# Patient Record
Sex: Female | Born: 1960 | Race: White | Hispanic: No | Smoking: Former smoker
Health system: Southern US, Community
[De-identification: ages and names within clinical notes are randomized; demographics above are authoritative.]

## PROBLEM LIST (undated history)

## (undated) DIAGNOSIS — F32A Depression, unspecified: Secondary | ICD-10-CM

## (undated) DIAGNOSIS — E079 Disorder of thyroid, unspecified: Secondary | ICD-10-CM

## (undated) DIAGNOSIS — E7800 Pure hypercholesterolemia, unspecified: Secondary | ICD-10-CM

## (undated) DIAGNOSIS — K219 Gastro-esophageal reflux disease without esophagitis: Secondary | ICD-10-CM

## (undated) HISTORY — PX: CHOLECYSTECTOMY: SHX55

## (undated) HISTORY — DX: Depression, unspecified: F32.A

## (undated) HISTORY — DX: Pure hypercholesterolemia, unspecified: E78.00

## (undated) HISTORY — DX: Gastro-esophageal reflux disease without esophagitis: K21.9

## (undated) HISTORY — DX: Disorder of thyroid, unspecified: E07.9

## (undated) HISTORY — PX: HERNIA REPAIR: SHX51

## (undated) HISTORY — PX: ABDOMINAL HYSTERECTOMY: SHX81

## (undated) HISTORY — PX: BRAIN SURGERY: SHX531

---

## 2020-01-13 ENCOUNTER — Emergency Department (HOSPITAL_BASED_OUTPATIENT_CLINIC_OR_DEPARTMENT_OTHER)
Admission: EM | Admit: 2020-01-13 | Discharge: 2020-01-13 | Disposition: A | Discharge status: Home / Self Care | Payer: BC Managed Care – PPO | Attending: Emergency Medicine | Admitting: Emergency Medicine

## 2020-01-13 ENCOUNTER — Emergency Department (HOSPITAL_BASED_OUTPATIENT_CLINIC_OR_DEPARTMENT_OTHER): Payer: BC Managed Care – PPO

## 2020-01-13 ENCOUNTER — Other Ambulatory Visit: Payer: Self-pay

## 2020-01-13 ENCOUNTER — Encounter (HOSPITAL_BASED_OUTPATIENT_CLINIC_OR_DEPARTMENT_OTHER): Payer: Self-pay

## 2020-01-13 DIAGNOSIS — X58XXXA Exposure to other specified factors, initial encounter: Secondary | ICD-10-CM | POA: Diagnosis not present

## 2020-01-13 DIAGNOSIS — Y9289 Other specified places as the place of occurrence of the external cause: Secondary | ICD-10-CM | POA: Insufficient documentation

## 2020-01-13 DIAGNOSIS — S4992XA Unspecified injury of left shoulder and upper arm, initial encounter: Secondary | ICD-10-CM

## 2020-01-13 DIAGNOSIS — S40922A Unspecified superficial injury of left upper arm, initial encounter: Secondary | ICD-10-CM | POA: Insufficient documentation

## 2020-01-13 DIAGNOSIS — Y999 Unspecified external cause status: Secondary | ICD-10-CM | POA: Insufficient documentation

## 2020-01-13 DIAGNOSIS — E079 Disorder of thyroid, unspecified: Secondary | ICD-10-CM | POA: Diagnosis not present

## 2020-01-13 DIAGNOSIS — Z87891 Personal history of nicotine dependence: Secondary | ICD-10-CM | POA: Insufficient documentation

## 2020-01-13 DIAGNOSIS — Y939 Activity, unspecified: Secondary | ICD-10-CM | POA: Diagnosis not present

## 2020-01-13 NOTE — ED Triage Notes (Signed)
Pt states she closed left UE in a freight elevator ~445pm-pain to left elbow area-NAD-steady gait

## 2020-01-13 NOTE — ED Provider Notes (Signed)
[facility] EMERGENCY DEPARTMENT Provider Note   CSN: [id number] Arrival date & time: 01/13/20  1727     History Chief Complaint  Patient presents with  . Arm Injury    Kristen Kerr is a 59 y.o. female with no pertinent past medical history that presents to the emergency department today for left arm injury.  Patient states that she had her elbow closed in freight elevator, around 4 hours ago.  States that she was able to pull her arm out, no twisting sensation or motion.  No popping.  Patient states that she immediately felt pain upon her elbow area, has never had injury to this area before.  No blood thinners.  Denies any numbness and tingling down into her arm or fingers.  Normal strength throughout.  Denies any injuries elsewhere.  States that pain is now a 6/10 without intervention.  Did not break skin.  States that she was in normal health before this happened.  Describes pain as a sharp aching pain.  No nausea, vomiting, shortness of breath, chest pain.  Pain does not radiate anywhere.  HPI     Past Medical History:  Diagnosis Date  . Depression   . GERD (gastroesophageal reflux disease)   . High cholesterol   . Thyroid disease     There are no problems to display for this patient.   Past Surgical History:  Procedure Laterality Date  . ABDOMINAL HYSTERECTOMY    . BRAIN SURGERY    . CHOLECYSTECTOMY    . HERNIA REPAIR       OB History   No obstetric history on file.     No family history on file.  Social History   Tobacco Use  . Smoking status: Former Games developer  . Smokeless tobacco: Never Used  Vaping Use  . Vaping Use: Never used  Substance Use Topics  . Alcohol use: Yes    Comment: occ  . Drug use: Never    Home Medications Prior to Admission medications   Not on File    Allergies    Doxycycline hyclate and Sulfa antibiotics  Review of Systems   Review of Systems  Constitutional: Negative for diaphoresis, fatigue and fever.  Eyes:  Negative for visual disturbance.  Respiratory: Negative for shortness of breath.   Cardiovascular: Negative for chest pain.  Gastrointestinal: Negative for nausea and vomiting.  Musculoskeletal: Positive for arthralgias (Left elbow pain). Negative for back pain and myalgias.  Skin: Negative for color change, pallor, rash and wound.  Neurological: Negative for syncope, weakness, light-headedness, numbness and headaches.  Psychiatric/Behavioral: Negative for behavioral problems and confusion.    Physical Exam Updated Vital Signs BP (!) 139/92 (BP Location: Right Arm)   Pulse 79   Temp 98.1 F (36.7 C) (Oral)   Resp 20   Ht 5\' 2"  (1.575 m)   Wt 66.2 kg   SpO2 98%   BMI 26.70 kg/m   Physical Exam Constitutional:      General: She is not in acute distress.    Appearance: Normal appearance. She is not ill-appearing, toxic-appearing or diaphoretic.  HENT:     Head: Normocephalic and atraumatic.  Cardiovascular:     Rate and Rhythm: Normal rate and regular rhythm.     Pulses: Normal pulses.  Pulmonary:     Effort: Pulmonary effort is normal.     Breath sounds: Normal breath sounds.  Musculoskeletal:        General: Normal range of motion.     Cervical  back: Normal range of motion.     Comments: Tenderness to palpation to left forearm.  Ecchymosis and bruising noted to left forearm, inferior to elbow joint.  Also some edema superior to elbow joint.  No inflammation or ecchymosis on elbow joint.  No tenderness to lateral or medial epicondyle.  No warmth or erythema noted.  Patient is able to move shoulder, elbow, wrist in all directions, 5/5. Normal sensation.  Radial pulse 2+.  Cap refill less than 2 seconds.  Right arm normal.  Skin:    General: Skin is warm and dry.     Capillary Refill: Capillary refill takes less than 2 seconds.  Neurological:     General: No focal deficit present.     Mental Status: She is alert and oriented to person, place, and time.     Cranial Nerves:  No cranial nerve deficit.     Motor: No weakness.  Psychiatric:        Mood and Affect: Mood normal.        Behavior: Behavior normal.        Thought Content: Thought content normal.     ED Results / Procedures / Treatments   Labs (all labs ordered are listed, but only abnormal results are displayed) Labs Reviewed - No data to display  EKG None  Radiology DG Elbow Complete Left  Result Date: 01/13/2020 CLINICAL DATA:  Injury. Left elbow pain after closing left upper extremity in a freight elevator. EXAM: LEFT ELBOW - COMPLETE 3+ VIEW COMPARISON:  None. FINDINGS: There is no evidence of fracture, dislocation, or joint effusion. There is no evidence of arthropathy or other focal bone abnormality. Soft tissue edema noted medially and posteriorly. No radiopaque foreign body or soft tissue air. IMPRESSION: Soft tissue edema. No fracture or subluxation. Electronically Signed   By: Narda Rutherford M.D.   On: 01/13/2020 18:36    Procedures Procedures (including critical care time)  Medications Ordered in ED Medications - No data to display  ED Course  I have reviewed the triage vital signs and the nursing notes.  Pertinent labs & imaging results that were available during my care of the patient were reviewed by me and considered in my medical decision making (see chart for details).    MDM Rules/Calculators/A&P                         Kristen Kerr is a 59 y.o. female with no pertinent past medical history that presents to the emergency department today for left arm injury.  Patient with mild ecchymosis and swelling to left upper extremity, sandwiched in between elbow.  No joint warmth, ecchymosis.  Did not break skin.  Patient is distally neurovascularly intact.  No numbness or tingling down arm.  X-rays not suggestive of fracture or dislocation.  We will have nurses provide compression with Ace bandage and have patient follow-up with Dr. Jordan Likes, Ortho.  Symptomatic treatment  discussed with patient, patient agreeable.  Patient to be discharged.  Doubt need for further emergent work up at this time. I explained the diagnosis and have given explicit precautions to return to the ER including for any other new or worsening symptoms. The patient understands and accepts the medical plan as it's been dictated and I have answered their questions. Discharge instructions concerning home care and prescriptions have been given. The patient is STABLE and is discharged to home in good condition.   Final Clinical Impression(s) / ED Diagnoses Final  diagnoses:  Injury of left upper extremity, initial encounter    Rx / DC Orders ED Discharge Orders    None       Farrel Gordon, PA-C 01/13/20 2159    Benjiman Core, MD 01/14/20 0005

## 2020-01-13 NOTE — Discharge Instructions (Addendum)
Please read and follow all provided instructions.  You have been seen today for forearm injury.  I want you to follow-up with Dr. Jordan Likes in the next couple of days, call his office.  Tests performed today include: An x-ray of the affected area - does NOT show any broken bones or dislocations.  Vital signs. See below for your results today.   Home care instructions: -- *PRICE in the first 24-48 hours after injury: Protect (with brace, splint, sling), if given by your provider Rest Ice- Do not apply ice pack directly to your skin, place towel or similar between your skin and ice/ice pack. Apply ice for 20 min, then remove for 40 min while awake Compression- Wear brace, elastic bandage, splint as directed by your provider Elevate affected extremity above the level of your heart when not walking around for the first 24-48 hours   Use Ibuprofen (Motrin/Advil) 600mg  every 6 hours as needed for pain (do not exceed max dose in 24 hours, 2400mg )  Follow-up instructions: Please follow-up with your primary care provider or the provided orthopedic physician (bone specialist) if you continue to have significant pain in 1 week. In this case you may have a more severe injury that requires further care.   Return instructions:  Please return if your toes or feet are numb or tingling, appear gray or blue, or you have severe pain (also elevate the leg and loosen splint or wrap if you were given one) Please return to the Emergency Department if you experience worsening symptoms.  Please return if you have any other emergent concerns. Additional Information:  Your vital signs today were: BP (!) 139/92 (BP Location: Right Arm)   Pulse 79   Temp 98.1 F (36.7 C) (Oral)   Resp 20   Ht 5\' 2"  (1.575 m)   Wt 66.2 kg   SpO2 98%   BMI 26.70 kg/m  If your blood pressure (BP) was elevated above 135/85 this visit, please have this repeated by your doctor within one month. ---------------

## 2020-01-15 ENCOUNTER — Telehealth: Payer: Self-pay | Admitting: Family Medicine

## 2020-01-15 NOTE — Telephone Encounter (Signed)
Called pt to offer ED follow-up appt w/Dr.Schmitz--per patient Arm is lots better, appt not needed.  --fyi.  --glh

## 2022-03-21 IMAGING — DX DG ELBOW COMPLETE 3+V*L*
4 series · 4 of 4 positions shown · non-contrast
Comparison: None.

CLINICAL DATA: Injury. Left elbow pain after closing left upper
extremity in a freight elevator.

EXAM:
LEFT ELBOW - COMPLETE 3+ VIEW

[elbow ap]
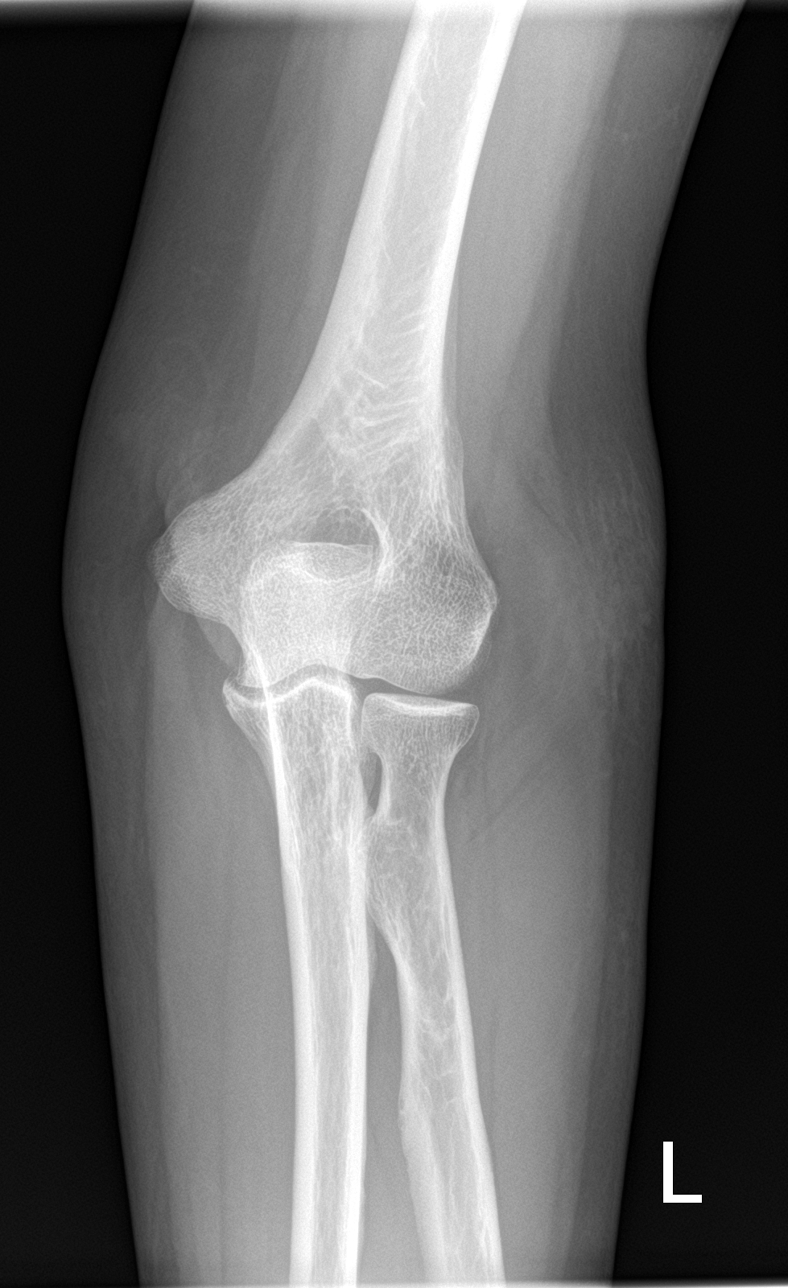

[elbow obl (1 of 2)]
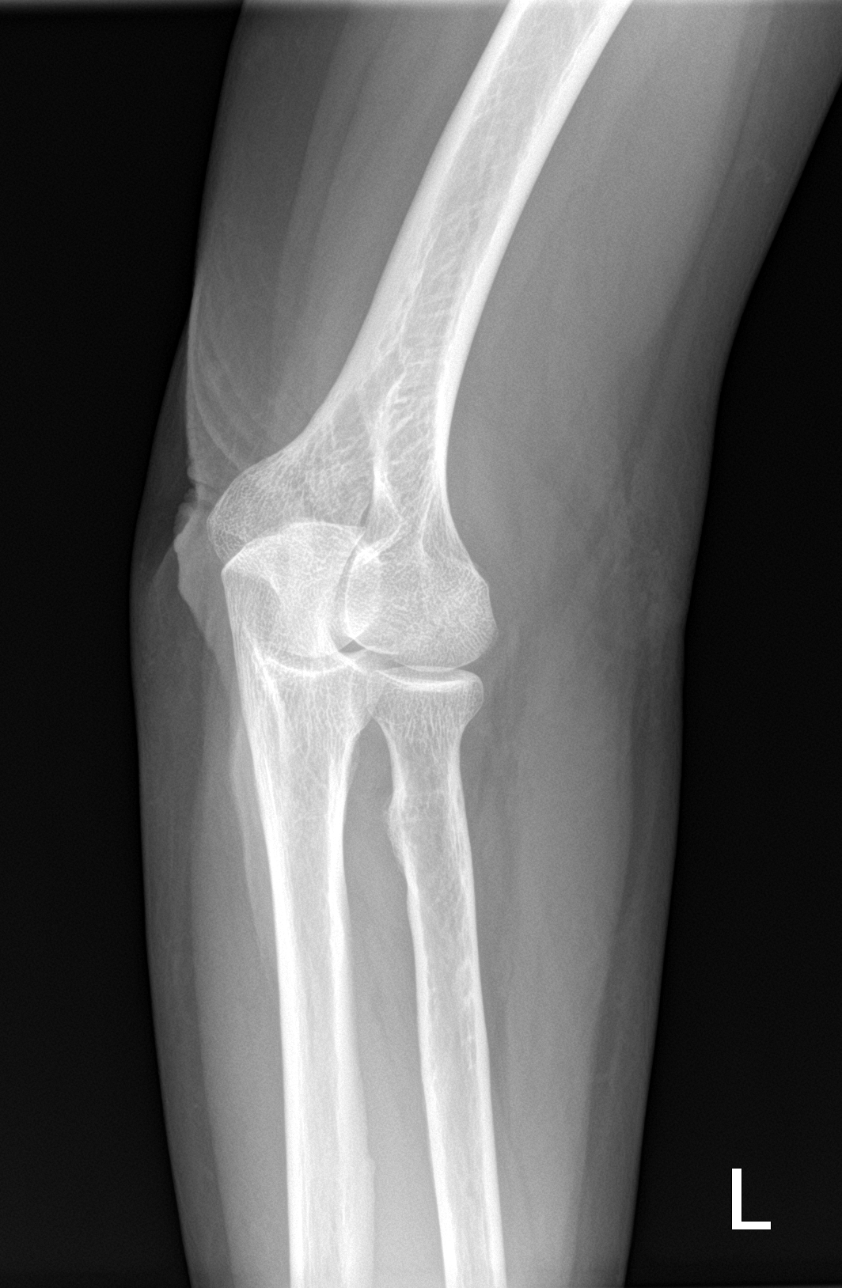

[elbow obl (2 of 2)]
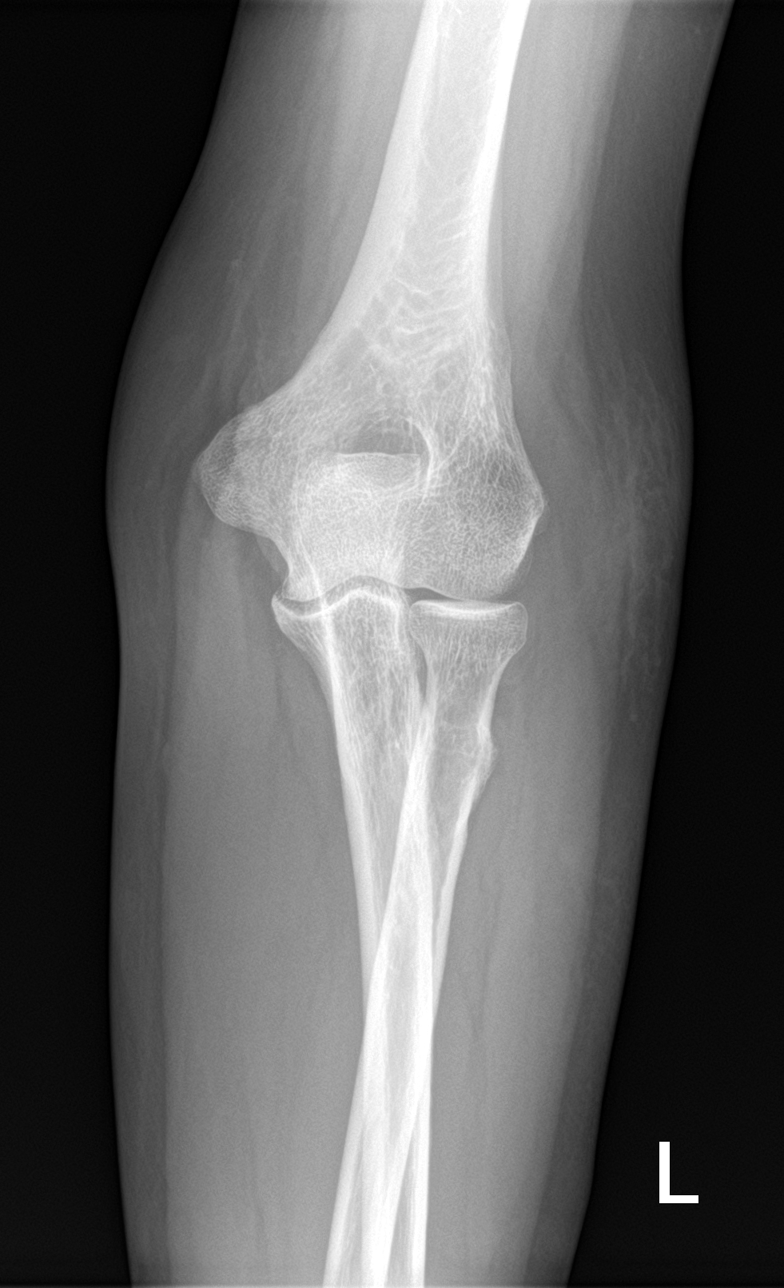

[elbow lat]
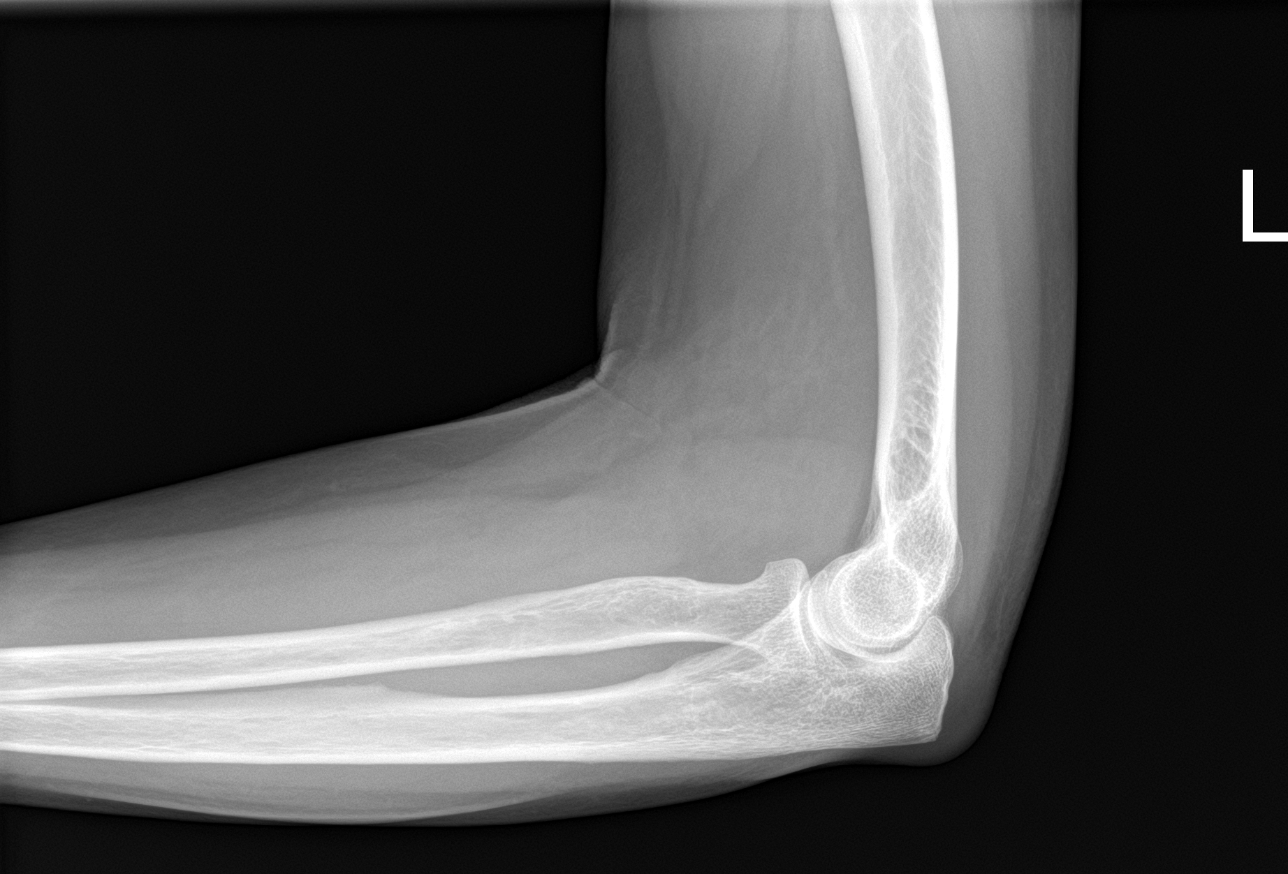

[4 of 4 positions shown; findings below may reference images not displayed]

FINDINGS: There is no evidence of fracture, dislocation, or joint effusion.
There is no evidence of arthropathy or other focal bone abnormality.
Soft tissue edema noted medially and posteriorly. No radiopaque
foreign body or soft tissue air.
IMPRESSION: Soft tissue edema. No fracture or subluxation.

## 2022-08-24 ENCOUNTER — Encounter: Payer: Self-pay | Admitting: *Deleted
